# Patient Record
Sex: Female | Born: 1990 | Race: Black or African American | Hispanic: No | Marital: Single
Health system: Southern US, Community
[De-identification: ages and names within clinical notes are randomized; demographics above are authoritative.]

---

## 2020-08-28 ENCOUNTER — Emergency Department (HOSPITAL_COMMUNITY)
Admission: EM | Admit: 2020-08-28 | Discharge: 2020-08-28 | Disposition: A | Discharge status: Home / Self Care | Payer: Medicaid Other | Attending: Emergency Medicine | Admitting: Emergency Medicine

## 2020-08-28 ENCOUNTER — Encounter (HOSPITAL_COMMUNITY): Payer: Self-pay

## 2020-08-28 ENCOUNTER — Emergency Department (HOSPITAL_COMMUNITY): Payer: Medicaid Other

## 2020-08-28 ENCOUNTER — Other Ambulatory Visit: Payer: Self-pay

## 2020-08-28 DIAGNOSIS — K529 Noninfective gastroenteritis and colitis, unspecified: Secondary | ICD-10-CM | POA: Insufficient documentation

## 2020-08-28 DIAGNOSIS — R109 Unspecified abdominal pain: Secondary | ICD-10-CM | POA: Diagnosis present

## 2020-08-28 LAB — COMPREHENSIVE METABOLIC PANEL
ALT: 17 U/L (ref 0–44)
AST: 16 U/L (ref 15–41)
Albumin: 4.1 g/dL (ref 3.5–5.0)
Alkaline Phosphatase: 47 U/L (ref 38–126)
Anion gap: 8 (ref 5–15)
BUN: 13 mg/dL (ref 6–20)
CO2: 27 mmol/L (ref 22–32)
Calcium: 9.1 mg/dL (ref 8.9–10.3)
Chloride: 102 mmol/L (ref 98–111)
Creatinine, Ser: 0.92 mg/dL (ref 0.44–1.00)
GFR, Estimated: >60 mL/min (ref >60)
Glucose, Bld: 101 mg/dL — ABNORMAL HIGH (ref 70–99)
Potassium: 3.4 mmol/L — ABNORMAL LOW (ref 3.5–5.1)
Sodium: 137 mmol/L (ref 135–145)
Total Bilirubin: 0.5 mg/dL (ref 0.3–1.2)
Total Protein: 7.5 g/dL (ref 6.5–8.1)

## 2020-08-28 LAB — CBC WITH DIFFERENTIAL/PLATELET
Abs Immature Granulocytes: 0.02 10*3/uL (ref 0.00–0.07)
Basophils Absolute: 0 10*3/uL (ref 0.0–0.1)
Basophils Relative: 0 %
Eosinophils Absolute: 0 10*3/uL (ref 0.0–0.5)
Eosinophils Relative: 0 %
HCT: 37.2 % (ref 36.0–46.0)
Hemoglobin: 12.2 g/dL (ref 12.0–15.0)
Immature Granulocytes: 0 %
Lymphocytes Relative: 6 %
Lymphs Abs: 0.6 10*3/uL — ABNORMAL LOW (ref 0.7–4.0)
MCH: 28.6 pg (ref 26.0–34.0)
MCHC: 32.8 g/dL (ref 30.0–36.0)
MCV: 87.3 fL (ref 80.0–100.0)
Monocytes Absolute: 0.8 10*3/uL (ref 0.1–1.0)
Monocytes Relative: 8 %
Neutro Abs: 9.1 10*3/uL — ABNORMAL HIGH (ref 1.7–7.7)
Neutrophils Relative %: 86 %
Platelets: 314 10*3/uL (ref 150–400)
RBC: 4.26 MIL/uL (ref 3.87–5.11)
RDW: 13.9 % (ref 11.5–15.5)
WBC: 10.6 10*3/uL — ABNORMAL HIGH (ref 4.0–10.5)
nRBC: 0 % (ref 0.0–0.2)

## 2020-08-28 LAB — URINALYSIS, ROUTINE W REFLEX MICROSCOPIC
Bilirubin Urine: NEGATIVE
Glucose, UA: NEGATIVE mg/dL
Hgb urine dipstick: NEGATIVE
Ketones, ur: 20 mg/dL — AB
Leukocytes,Ua: NEGATIVE
Nitrite: NEGATIVE
Protein, ur: NEGATIVE mg/dL
Specific Gravity, Urine: 1.039 — ABNORMAL HIGH (ref 1.005–1.030)
pH: 9 — ABNORMAL HIGH (ref 5.0–8.0)

## 2020-08-28 LAB — LIPASE, BLOOD: Lipase: 26 U/L (ref 11–51)

## 2020-08-28 LAB — I-STAT BETA HCG BLOOD, ED (MC, WL, AP ONLY): I-stat hCG, quantitative: <5 m[IU]/mL (ref <5)

## 2020-08-28 MED ORDER — AMOXICILLIN-POT CLAVULANATE 875-125 MG PO TABS: 1.0000 | ORAL_TABLET | Freq: Two times a day (BID) | ORAL | 0 refills | Status: AC

## 2020-08-28 MED ORDER — ONDANSETRON 4 MG PO TBDP: ORAL_TABLET | ORAL | 0 refills | Status: AC

## 2020-08-28 MED ORDER — SODIUM CHLORIDE 0.9 % IV BOLUS
1000.0000 mL | Freq: Once | INTRAVENOUS | Status: AC
  Administered 2020-08-28: 1000 mL via INTRAVENOUS

## 2020-08-28 MED ORDER — ONDANSETRON HCL 4 MG/2ML IJ SOLN
4.0000 mg | Freq: Once | INTRAMUSCULAR | Status: AC
Start: 2020-08-28 — End: 2020-08-28
  Administered 2020-08-28: 4 mg via INTRAVENOUS
  Filled 2020-08-28: qty 2

## 2020-08-28 MED ORDER — DICYCLOMINE HCL 20 MG PO TABS: 20.0000 mg | ORAL_TABLET | Freq: Two times a day (BID) | ORAL | 0 refills | Status: AC

## 2020-08-28 MED ORDER — DICYCLOMINE HCL 10 MG PO CAPS
20.0000 mg | ORAL_CAPSULE | Freq: Once | ORAL | Status: AC
  Administered 2020-08-28: 20 mg via ORAL
  Filled 2020-08-28: qty 2

## 2020-08-28 MED ORDER — MORPHINE SULFATE (PF) 4 MG/ML IV SOLN
4.0000 mg | Freq: Once | INTRAVENOUS | Status: AC
  Administered 2020-08-28: 4 mg via INTRAVENOUS
  Filled 2020-08-28: qty 1

## 2020-08-28 MED ORDER — IOHEXOL 350 MG/ML SOLN
80.0000 mL | Freq: Once | INTRAVENOUS | Status: AC | PRN
  Administered 2020-08-28: 80 mL via INTRAVENOUS

## 2020-08-28 NOTE — ED Provider Notes (Signed)
Emergency Medicine Provider Triage Evaluation Note  Sarah Brown , a 30 y.o. female  was evaluated in triage.  Pt complains of lower abd pain, nv, chills that started pta.  Review of Systems  Positive: Abd pain, nv, chills Negative: diarrhea  Physical Exam  BP 106/82 (BP Location: Left Arm)   Pulse 97   Temp 98.4 F (36.9 C) (Oral)   Resp (!) 22   SpO2 100%  Gen:   Awake, no distress   Resp:  Normal effort  MSK:   Moves extremities without difficulty  Other:  Rlq ttp  Medical Decision Making  Medically screening exam initiated at 5:20 PM.  Appropriate orders placed.  Sarah Brown was informed that the remainder of the evaluation will be completed by another provider, this initial triage assessment does not replace that evaluation, and the importance of remaining in the ED until their evaluation is complete.     Sarah Brown 08/28/20 1720    Linwood Dibbles, MD 08/31/20 1349

## 2020-08-28 NOTE — ED Provider Notes (Signed)
[facility]-EMERGENCY DEPT Provider Note   CSN: [id number] Arrival date & time: 08/28/20  1700     History Chief Complaint  Patient presents with   Abdominal Pain   Emesis    Sarah Brown is a 30 y.o. female.  Sarah Brown is a 30 y.o. female who is otherwise healthy, presents to the ED for evaluation of abdominal pain and vomiting.  Symptoms started earlier today, were sudden in onset.  Patient reports abdominal pain is generalized but most severe in the epigastric region and right lower quadrants.  She has been having numerous episodes of nausea and vomiting associated with this.  No hematemesis.  Denies diarrhea or constipation reports she has been having normal nonbloody bowel movements.  No fevers or chills.  No associated urinary symptoms.  No vaginal discharge or bleeding.  Patient denies any prior history of abdominal surgeries and has not had similar symptoms previously.  No meds prior to arrival and no other aggravating or alleviating factors.  The history is provided by the patient.  Emesis Associated symptoms: abdominal pain   Associated symptoms: no arthralgias, no chills, no cough, no diarrhea, no fever and no myalgias       History reviewed. No pertinent past medical history.  There are no problems to display for this patient.   History reviewed. No pertinent surgical history.   OB History   No obstetric history on file.     History reviewed. No pertinent family history.  Social History   Substance Use Topics   Alcohol use: Not Currently   Drug use: Never    Home Medications Prior to Admission medications   Medication Sig Start Date End Date Taking? Authorizing Provider  amoxicillin-clavulanate (AUGMENTIN) 875-125 MG tablet Take 1 tablet by mouth 2 (two) times daily. One po bid x 7 days 08/28/20  Yes Dartha Lodge, PA-C  dicyclomine (BENTYL) 20 MG tablet Take 1 tablet (20 mg total) by mouth 2 (two) times daily. 08/28/20  Yes Dartha Lodge, PA-C  ondansetron (ZOFRAN ODT) 4 MG disintegrating tablet 4mg  ODT q4 hours prn nausea/vomit 08/28/20  Yes 10/28/20, PA-C    Allergies    Patient has no known allergies.  Review of Systems   Review of Systems  Constitutional:  Negative for chills and fever.  HENT: Negative.    Respiratory:  Negative for cough and shortness of breath.   Cardiovascular:  Negative for chest pain.  Gastrointestinal:  Positive for abdominal pain, nausea and vomiting. Negative for blood in stool, constipation and diarrhea.  Genitourinary:  Negative for dysuria, frequency, vaginal bleeding and vaginal discharge.  Musculoskeletal:  Negative for arthralgias and myalgias.  Skin:  Negative for color change and rash.  Neurological:  Negative for dizziness, syncope and light-headedness.  All other systems reviewed and are negative.  Physical Exam Updated Vital Signs BP 123/82   Pulse (!) 116   Temp 98.4 F (36.9 C) (Oral)   Resp 18   SpO2 100%   Physical Exam Vitals and nursing note reviewed.  Constitutional:      General: She is not in acute distress.    Appearance: Normal appearance. She is well-developed. She is not diaphoretic.     Comments: Patient is alert, actively vomiting and somewhat ill-appearing.  HENT:     Head: Normocephalic and atraumatic.  Eyes:     General:        Right eye: No discharge.        Left eye:  No discharge.     Pupils: Pupils are equal, round, and reactive to light.  Cardiovascular:     Rate and Rhythm: Normal rate and regular rhythm.     Pulses: Normal pulses.     Heart sounds: Normal heart sounds.  Pulmonary:     Effort: Pulmonary effort is normal. No respiratory distress.     Breath sounds: Normal breath sounds. No wheezing or rales.     Comments: Respirations equal and unlabored, patient able to speak in full sentences, lungs clear to auscultation bilaterally  Abdominal:     General: Bowel sounds are normal. There is no distension.     Palpations:  Abdomen is soft. There is no mass.     Tenderness: There is generalized abdominal tenderness and tenderness in the right lower quadrant and epigastric area. There is no guarding.     Comments: Abdomen soft, nondistended, sounds present throughout, there is generalized abdominal tenderness, tenderness more pronounced in the epigastric region and right lower quadrant, no guarding or peritoneal signs.  Musculoskeletal:        General: No deformity.     Cervical back: Neck supple.  Skin:    General: Skin is warm and dry.     Capillary Refill: Capillary refill takes less than 2 seconds.  Neurological:     Mental Status: She is alert and oriented to person, place, and time.     Coordination: Coordination normal.     Comments: Speech is clear, able to follow commands Moves extremities without ataxia, coordination intact  Psychiatric:        Mood and Affect: Mood normal.        Behavior: Behavior normal.    ED Results / Procedures / Treatments   Labs (all labs ordered are listed, but only abnormal results are displayed) Labs Reviewed  CBC WITH DIFFERENTIAL/PLATELET - Abnormal; Notable for the following components:      Result Value   WBC 10.6 (*)    Neutro Abs 9.1 (*)    Lymphs Abs 0.6 (*)    All other components within normal limits  COMPREHENSIVE METABOLIC PANEL - Abnormal; Notable for the following components:   Potassium 3.4 (*)    Glucose, Bld 101 (*)    All other components within normal limits  URINALYSIS, ROUTINE W REFLEX MICROSCOPIC - Abnormal; Notable for the following components:   Color, Urine STRAW (*)    Specific Gravity, Urine 1.039 (*)    pH 9.0 (*)    Ketones, ur 20 (*)    All other components within normal limits  LIPASE, BLOOD  I-STAT BETA HCG BLOOD, ED ([facility], [facility], [facility] ONLY)    EKG None  Radiology CT ABDOMEN PELVIS W CONTRAST  Result Date: 08/28/2020 CLINICAL DATA:  Right lower quadrant abdominal pain, appendicitis suspected EXAM: CT ABDOMEN AND PELVIS WITH  CONTRAST TECHNIQUE: Multidetector CT imaging of the abdomen and pelvis was performed using the standard protocol following bolus administration of intravenous contrast. CONTRAST:  31mL OMNIPAQUE IOHEXOL 350 MG/ML SOLN COMPARISON:  None. FINDINGS: Lower chest: Lung bases are clear. Normal heart size. No pericardial effusion. Hepatobiliary: No worrisome focal liver lesions. Smooth liver surface contour. Normal hepatic attenuation. Normal gallbladder and biliary tree. Pancreas: No pancreatic ductal dilatation or surrounding inflammatory changes. Spleen: Normal in size. No concerning splenic lesions. Adrenals/Urinary Tract: Normal adrenals. Kidneys are normally located with symmetric enhancement. No suspicious renal lesion, urolithiasis or hydronephrosis. Urinary bladder is unremarkable for the degree of distention. Stomach/Bowel: Distal esophagus, stomach and duodenal  sweep are unremarkable. No small bowel wall thickening or dilatation. Normal, noninflamed air-filled appendix seen in the right lower quadrant coursing in a retrocecal fashion. Very mild edematous pancolonic mural thickening. No evidence of bowel obstruction. Vascular/Lymphatic: No significant vascular findings are present. No enlarged abdominal or pelvic lymph nodes. Reproductive: Anteverted uterus. Subendometrial enhancement is nonspecific and possibly contrast timing related. No worrisome endometrial thickening accounting for a reproductive age female. No concerning adnexal masses. Other: Small volume low-attenuation free fluid in the deep pelvis, nonspecific in a reproductive age female. Can be a combination of reactive and/or physiologic free fluid. No free air. No organized collection or abscess. Tiny fat containing supraumbilical hernia (2/35). No bowel containing hernia. Musculoskeletal: No acute osseous abnormality or suspicious osseous lesion. Large disc bulge and superimposed central protrusion at the level of L5-S1 with resulting severe canal  stenosis at this level (2/49, 5/79). Mild to moderate foraminal impingement bilaterally as well. IMPRESSION: Normal appendix coursing in a retrocecal position. Suspect some very mild edematous pancolonic mural thickening suggestive of a colitis of likely infectious or inflammatory etiology. Incidental note made of disc bulge and large central disc protrusion L5-S1 with severe canal stenosis. Electronically Signed   By: Kreg ShropshirePrice  DeHay M.D.   On: 08/28/2020 21:22    Procedures Procedures   Medications Ordered in ED Medications  sodium chloride 0.9 % bolus 1,000 mL (0 mLs Intravenous Stopped 08/28/20 2207)  ondansetron (ZOFRAN) injection 4 mg (4 mg Intravenous Given 08/28/20 2041)  morphine 4 MG/ML injection 4 mg (4 mg Intravenous Given 08/28/20 2041)  iohexol (OMNIPAQUE) 350 MG/ML injection 80 mL (80 mLs Intravenous Contrast Given 08/28/20 2103)  dicyclomine (BENTYL) capsule 20 mg (20 mg Oral Given 08/28/20 2208)    ED Course  I have reviewed the triage vital signs and the nursing notes.  Pertinent labs & imaging results that were available during my care of the patient were reviewed by me and considered in my medical decision making (see chart for details).    MDM Rules/Calculators/A&P                           Patient presents to the ED with complaints of abdominal pain. Patient nontoxic appearing, in no apparent distress, vitals significant for mild tachycardia on arrival, otherwise unremarkable. On exam patient tender to palpation diffusely most notably in the epigastric region and right lower quadrant, no peritoneal signs. Will evaluate with labs and CT abdomen pelvis. Analgesics, anti-emetics, and fluids administered.   Additional history obtained:  Additional history obtained from chart review & nursing note review.   Lab Tests:  I Ordered, reviewed, and interpreted labs, which included:  CBC: Mild leukocytosis of 10.6, normal hemoglobin CMP: Minimal hypokalemia of 3.4, no other  significant electrolyte derangements, normal renal and liver function Lipase: WNL UA: Some ketones present in urine, no evidence of infection, suspect ketones in the setting of some dehydration from vomiting Preg test: Negative  Imaging Studies ordered:  I ordered imaging studies which included CT abd pelvis, I independently reviewed, formal radiology impression shows:  Mild edematous pain colonic mural thickening concerning for colitis, potentially infectious or inflammatory.  No other acute abdominal findings, incidental finding of L5-S1 disc protrusion with canal stenosis but patient not having any back pain currently.  ED Course:   RE-EVAL: Symptoms are significantly improved, patient no longer vomiting and reports pain is now very mild intermittent  On repeat abdominal exam patient remains without peritoneal signs.  CT consistent with mild pancolitis, patient with no personal or family history of IBD, will treat with course of Augmentin, patient has GI doctor in [facility] where she is from and has an upcoming follow-up appointment with them in a few weeks, I have stressed the importance of follow-up to ensure that colitis has resolved after antibiotics if not patient will likely need further evaluation for IBD.  Patient tolerating PO in the emergency department. Will discharge home with supportive measures. I discussed results, treatment plan, need for PCP follow-up, and return precautions with the patient. Provided opportunity for questions, patient confirmed understanding and is in agreement with plan.    Portions of this note were generated with Scientist, clinical (histocompatibility and immunogenetics). Dictation errors may occur despite best attempts at proofreading.    Final Clinical Impression(s) / ED Diagnoses Final diagnoses:  Colitis    Rx / DC Orders ED Discharge Orders          Ordered    dicyclomine (BENTYL) 20 MG tablet  2 times daily        08/28/20 2244    ondansetron (ZOFRAN ODT) 4  MG disintegrating tablet        08/28/20 2244    amoxicillin-clavulanate (AUGMENTIN) 875-125 MG tablet  2 times daily        08/28/20 2244             Dartha Lodge, PA-C 08/29/20 0100    Ernie Avena, MD 08/29/20 2313

## 2020-08-28 NOTE — Discharge Instructions (Addendum)
Today is reassuring, CT shows signs of colitis.  Please take antibiotics twice daily for the next week.  Use Zofran as needed for nausea and vomiting and Bentyl to help with cramping and abdominal pain.  It is very important that you follow-up closely with your GI doctor at home in the next few weeks to ensure that inflammation and signs of colitis have resolved after treatment, otherwise may need colonoscopy and further evaluation for inflammatory bowel disease.   Make sure you are drinking plenty of fluids, eat bland diet for the next few days as symptoms improve you can return to your normal diet.

## 2020-08-28 NOTE — ED Triage Notes (Signed)
RLQ abd pain and headache emesis x7 hrs

## 2020-08-29 ENCOUNTER — Encounter (HOSPITAL_COMMUNITY): Payer: Self-pay | Admitting: Student

## 2022-04-29 IMAGING — CT CT ABD-PELV W/ CM
2 of 4 series · 16 of 46 positions shown, 18 images · IV contrast (omnipaque)
Comparison: None.

CLINICAL DATA: Right lower quadrant abdominal pain, appendicitis
suspected

EXAM:
CT ABDOMEN AND PELVIS WITH CONTRAST
TECHNIQUE: Multidetector CT imaging of the abdomen and pelvis was performed
using the standard protocol following bolus administration of
intravenous contrast.
CONTRAST:  80mL OMNIPAQUE IOHEXOL 350 MG/ML SOLN

[Series 2: axial st · axial · 0.69mm/px · z∈[-397,-22]mm · 13 of 86 slices shown, 15 images]
[im 6/86  soft-tissue]
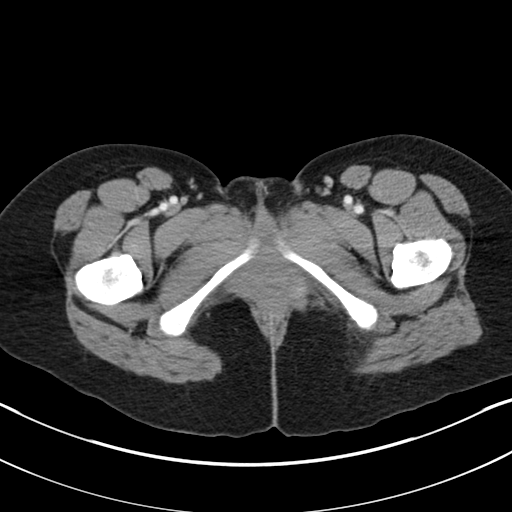
[im 6/86  bone]
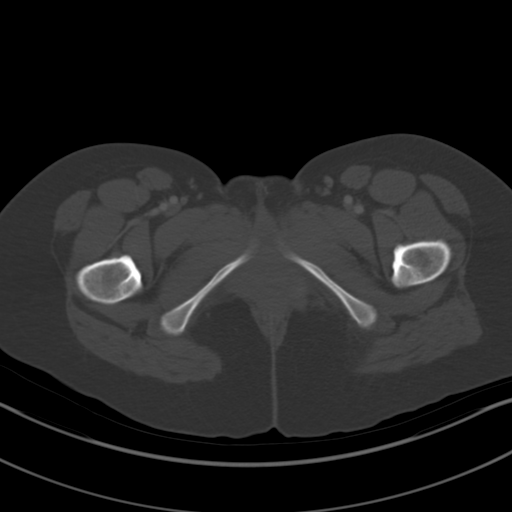
[im 11/86  soft-tissue]
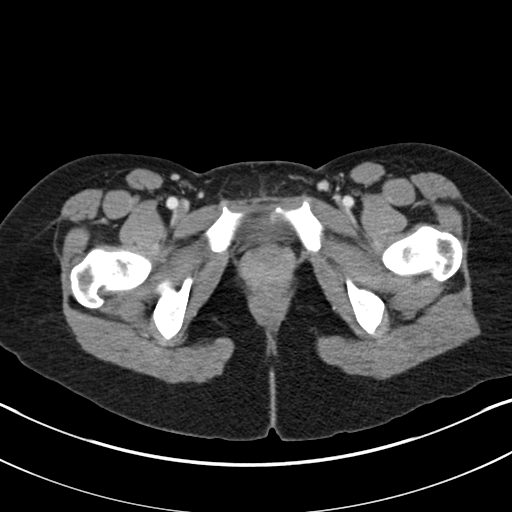
[im 21/86  soft-tissue]
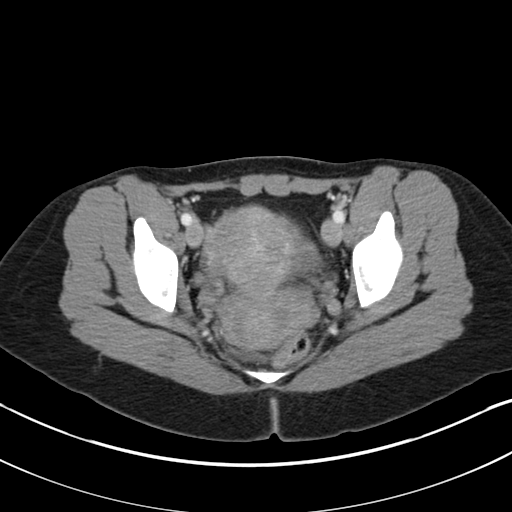
[im 26/86  soft-tissue]
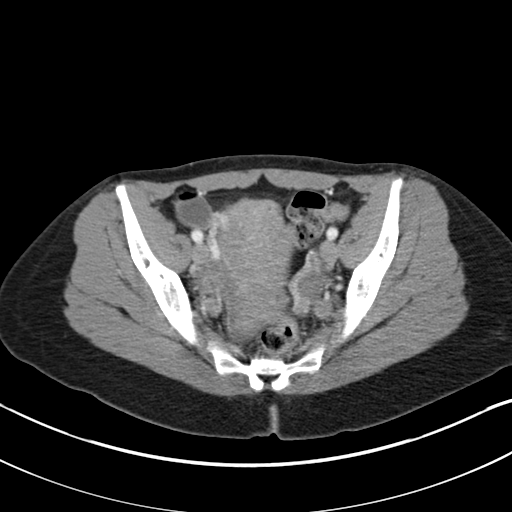
[im 31/86  soft-tissue]
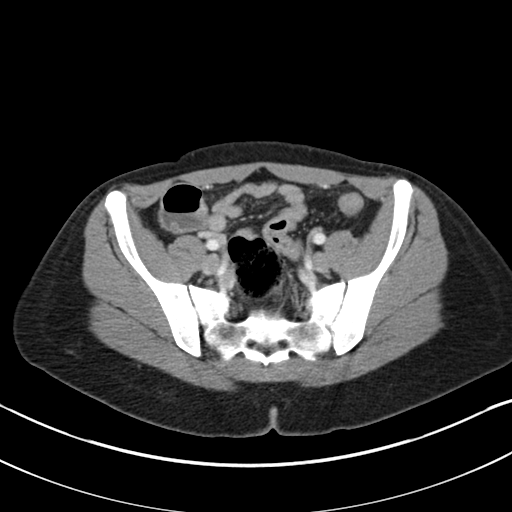
[im 36/86  soft-tissue]
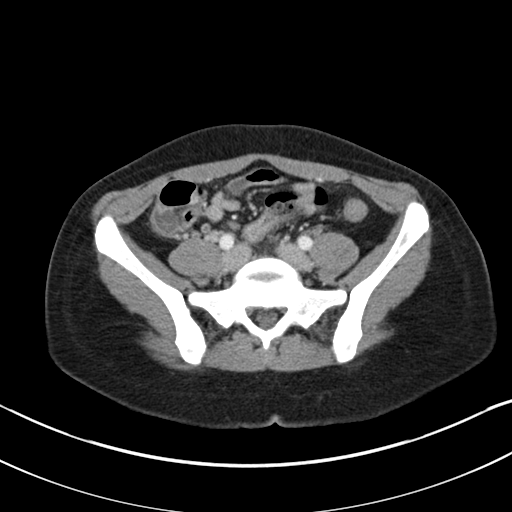
[im 46/86  soft-tissue]
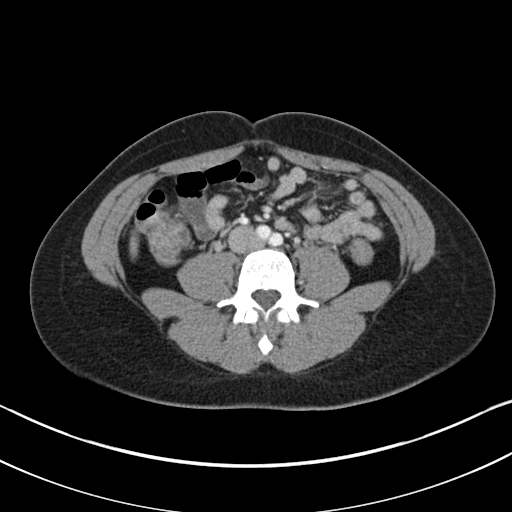
[im 51/86  soft-tissue]
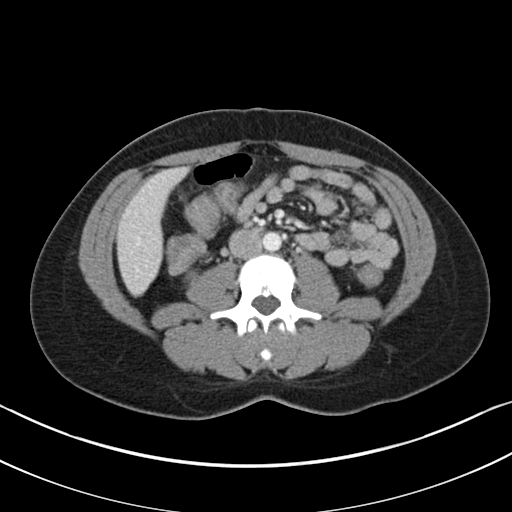
[im 56/86  soft-tissue]
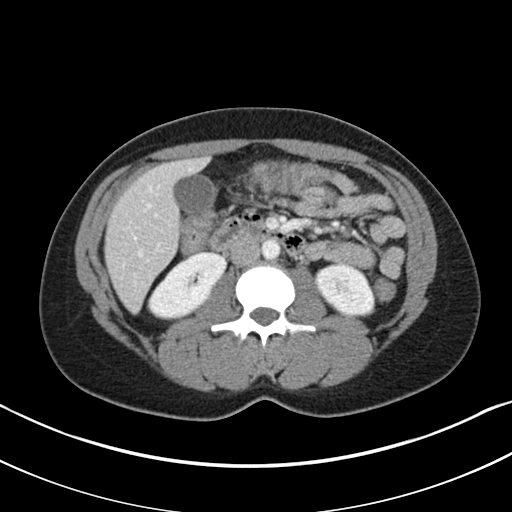
[im 56/86  bone]
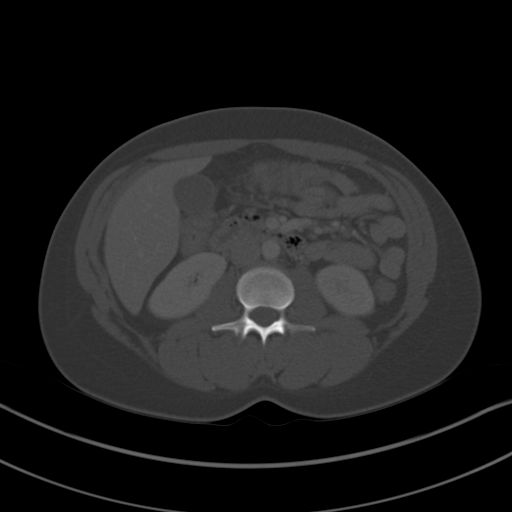
[im 61/86  soft-tissue]
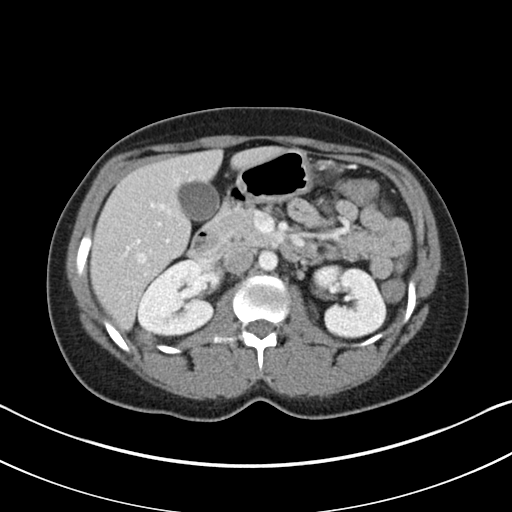
[im 66/86  soft-tissue]
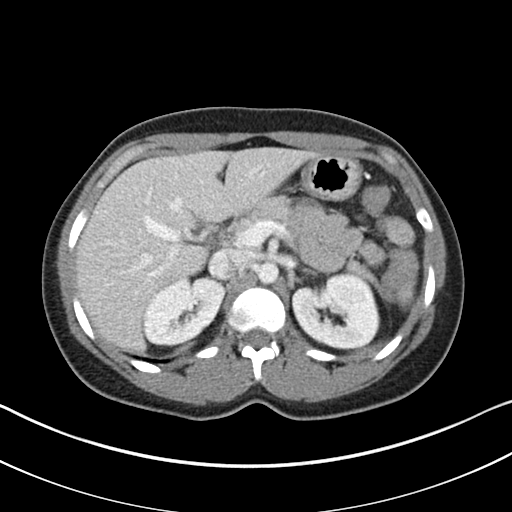
[im 76/86  soft-tissue]
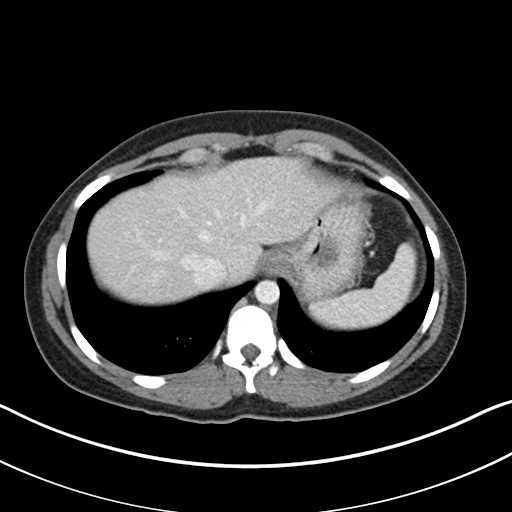
[im 81/86  soft-tissue]
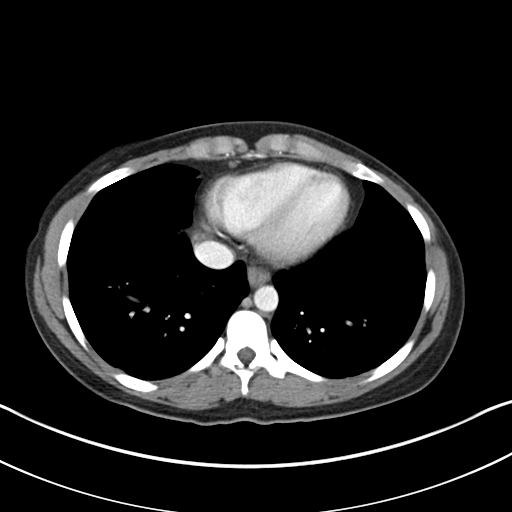

[Series 4: coronal st · coronal · 0.73mm/px · 3 of 111 slices shown]
[im 37/111  soft-tissue]
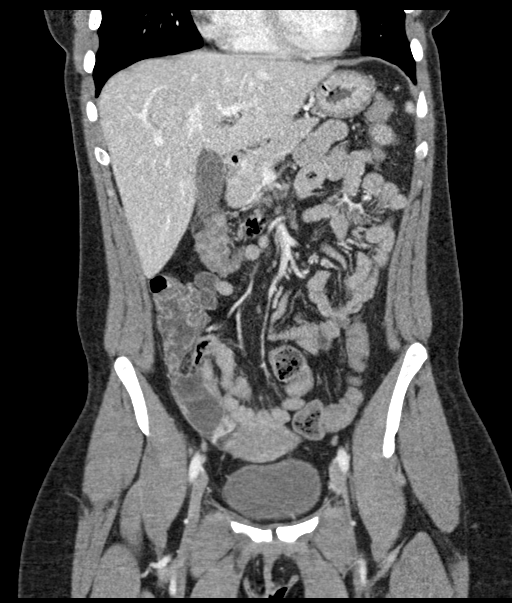
[im 49/111  soft-tissue]
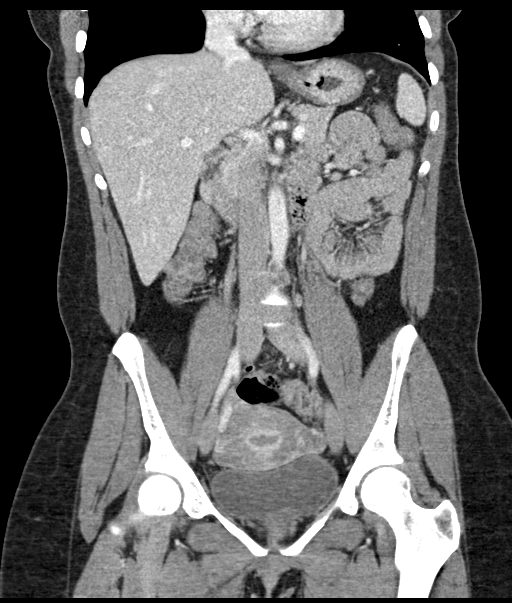
[im 62/111  soft-tissue]
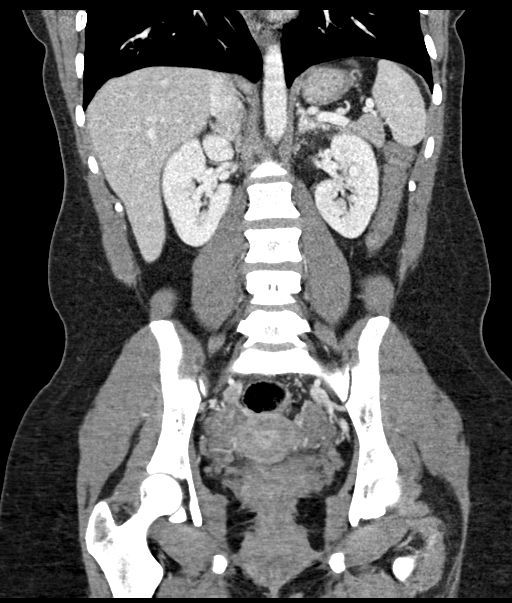

[16 of 46 positions shown; findings below may reference images not displayed]

FINDINGS: Lower chest: Lung bases are clear. Normal heart size. No pericardial
effusion.

Hepatobiliary: No worrisome focal liver lesions. Smooth liver
surface contour. Normal hepatic attenuation. Normal gallbladder and
biliary tree.

Pancreas: No pancreatic ductal dilatation or surrounding
inflammatory changes.

Spleen: Normal in size. No concerning splenic lesions.

Adrenals/Urinary Tract: Normal adrenals. Kidneys are normally
located with symmetric enhancement. No suspicious renal lesion,
urolithiasis or hydronephrosis. Urinary bladder is unremarkable for
the degree of distention.

Stomach/Bowel: Distal esophagus, stomach and duodenal sweep are
unremarkable. No small bowel wall thickening or dilatation. Normal,
noninflamed air-filled appendix seen in the right lower quadrant
coursing in a retrocecal fashion. Very mild edematous pancolonic
mural thickening. No evidence of bowel obstruction.

Vascular/Lymphatic: No significant vascular findings are present. No
enlarged abdominal or pelvic lymph nodes.

Reproductive: Anteverted uterus. Subendometrial enhancement is
nonspecific and possibly contrast timing related. No worrisome
endometrial thickening accounting for a reproductive age female. No
concerning adnexal masses.

Other: Small volume low-attenuation free fluid in the deep pelvis,
nonspecific in a reproductive age female. Can be a combination of
reactive and/or physiologic free fluid. No free air. No organized
collection or abscess. Tiny fat containing supraumbilical hernia
(2/35). No bowel containing hernia.

Musculoskeletal: No acute osseous abnormality or suspicious osseous
lesion. Large disc bulge and superimposed central protrusion at the
level of L5-S1 with resulting severe canal stenosis at this level
(2/49, 5/79). Mild to moderate foraminal impingement bilaterally as
well.
IMPRESSION: Normal appendix coursing in a retrocecal position.

Suspect some very mild edematous pancolonic mural thickening
suggestive of a colitis of likely infectious or inflammatory
etiology.

Incidental note made of disc bulge and large central disc protrusion
L5-S1 with severe canal stenosis.
